# Patient Record
Sex: Male | Born: 1965 | Race: White | Hispanic: No | Marital: Single | Smoking: Never smoker
Health system: Southern US, Community
[De-identification: ages and names within clinical notes are randomized; demographics above are authoritative.]

## PROBLEM LIST (undated history)

## (undated) DIAGNOSIS — I10 Essential (primary) hypertension: Secondary | ICD-10-CM

## (undated) DIAGNOSIS — I82409 Acute embolism and thrombosis of unspecified deep veins of unspecified lower extremity: Secondary | ICD-10-CM

## (undated) DIAGNOSIS — E119 Type 2 diabetes mellitus without complications: Secondary | ICD-10-CM

---

## 2019-01-26 ENCOUNTER — Emergency Department
Admission: EM | Admit: 2019-01-26 | Discharge: 2019-01-26 | Disposition: A | Discharge status: Home / Self Care | Payer: 59 | Source: Home / Self Care | Attending: Family Medicine | Admitting: Family Medicine

## 2019-01-26 ENCOUNTER — Emergency Department: Payer: 59

## 2019-01-26 ENCOUNTER — Other Ambulatory Visit: Payer: Self-pay

## 2019-01-26 DIAGNOSIS — I8002 Phlebitis and thrombophlebitis of superficial vessels of left lower extremity: Secondary | ICD-10-CM | POA: Diagnosis not present

## 2019-01-26 DIAGNOSIS — L03116 Cellulitis of left lower limb: Secondary | ICD-10-CM | POA: Diagnosis not present

## 2019-01-26 DIAGNOSIS — I824Z2 Acute embolism and thrombosis of unspecified deep veins of left distal lower extremity: Secondary | ICD-10-CM

## 2019-01-26 LAB — POCT CBC W AUTO DIFF (K'VILLE URGENT CARE)

## 2019-01-26 MED ORDER — APIXABAN 5 MG PO TABS: ORAL_TABLET | ORAL | 0 refills | Status: AC

## 2019-01-26 MED ORDER — CLINDAMYCIN HCL 300 MG PO CAPS: ORAL_CAPSULE | ORAL | 0 refills | Status: AC

## 2019-01-26 MED ORDER — APIXABAN 5 MG PO TABS: 5.0000 mg | ORAL_TABLET | Freq: Two times a day (BID) | ORAL | 0 refills | Status: AC

## 2019-01-26 NOTE — Discharge Instructions (Addendum)
Apply heating pad to left knee and thigh 2 or 3 times daily.  Elevate leg whenever possible.  If symptoms become significantly worse during the night or over the weekend, proceed to the local emergency room.

## 2019-01-26 NOTE — ED Triage Notes (Addendum)
Pt noticed a knot on left leg where he has a varicose vein 2 days ago.  It keeps getting larger.

## 2019-01-26 NOTE — ED Provider Notes (Signed)
[facility]    CSN: [id number] Arrival date & time: 01/26/19  1539     History   Chief Complaint Chief Complaint  Patient presents with   Leg Pain    HPI Brandon Sicksdward Pyle Jr. is a 53 y.o. male.   Two days ago patient noticed a tender "knot" on his left leg below the patella.  He has developed increasing pain, erythema, and mild warmth extending from below his left knee to his left mid-medial thigh.  He recalls no injury but admits that he often must kneel on his left knee at work.  He denies chest pain or shortness of breath, and no fevers, chills, and sweats.  The history is provided by the patient.  Leg Pain Location:  Leg and knee Time since incident:  2 days Injury: no   Leg location:  L leg and L upper leg Knee location:  L knee Pain details:    Quality:  Aching   Radiates to:  Does not radiate   Severity:  Mild   Onset quality:  Gradual   Timing:  Constant   Progression:  Worsening Chronicity:  New Prior injury to area:  No Relieved by:  None tried Worsened by:  Extension and flexion Ineffective treatments:  None tried Associated symptoms: swelling   Associated symptoms: no decreased ROM, no fatigue, no fever, no itching, no muscle weakness, no numbness, no stiffness and no tingling     History reviewed:  No history of previous bleeding, cancer, renal or liver failure, thrombocytopenia, previous stroke, diabetes, anemia, antiplatelet therapy, recent surgery, frequent falls, alcohol abuse  Active problems:  none  Past surgical history not recorded by patient     Home Medications    Prior to Admission medications   Medication Sig Start Date End Date Taking? Authorizing Provider  apixaban (ELIQUIS) 5 MG TABS tablet Take 2 tabs PO BID for 7 days 01/26/19   Lattie HawBeese, Kerwin Augustus A, MD  apixaban (ELIQUIS) 5 MG TABS tablet Take 1 tablet (5 mg total) by mouth 2 (two) times daily. 01/26/19   Lattie HawBeese, Jadis Mika A, MD  clindamycin (CLEOCIN) 300 MG capsule Take one  cap PO Q6hr 01/26/19   Lattie HawBeese, Kyerra Vargo A, MD    Family History No family history of bleeding disorders.  Social History Social History   Tobacco Use   Smoking status: Not smoker  Substance Use Topics   Alcohol use: Not on file   Drug use: Not on file     Allergies   Patient has no known allergies.   Review of Systems Review of Systems  Constitutional: Negative for activity change, appetite change, chills, diaphoresis, fatigue and fever.  HENT: Negative.   Eyes: Negative.   Respiratory: Negative for cough, chest tightness, shortness of breath, wheezing and stridor.   Cardiovascular: Positive for leg swelling. Negative for chest pain.  Gastrointestinal: Negative.   Genitourinary: Negative.   Musculoskeletal: Negative.  Negative for stiffness.  Skin: Positive for color change. Negative for itching and wound.  Neurological: Negative for headaches.     Physical Exam Triage Vital Signs ED Triage Vitals  Enc Vitals Group     BP      Pulse      Resp      Temp      Temp src      SpO2      Weight      Height      Head Circumference      Peak Flow  Pain Score      Pain Loc      Pain Edu?      Excl. in GC?    No data found.  Updated Vital Signs BP (!) 165/105 (BP Location: Right Arm)    Pulse (!) 106    Temp 98.4 F (36.9 C) (Oral)    Resp 20    Ht 5\' 11"  (1.803 m)    Wt 108.9 kg    SpO2 96%    BMI 33.47 kg/m   Visual Acuity Right Eye Distance:   Left Eye Distance:   Bilateral Distance:    Right Eye Near:   Left Eye Near:    Bilateral Near:     Physical Exam Vitals signs and nursing note reviewed.  Constitutional:      General: He is not in acute distress.    Appearance: He is obese.  HENT:     Head: Normocephalic.     Right Ear: External ear normal.     Left Ear: External ear normal.     Nose: Nose normal.     Mouth/Throat:     Pharynx: Oropharynx is clear.  Eyes:     Conjunctiva/sclera: Conjunctivae normal.     Pupils: Pupils are equal,  round, and reactive to light.  Cardiovascular:     Rate and Rhythm: Tachycardia present.     Heart sounds: Normal heart sounds.  Pulmonary:     Breath sounds: Normal breath sounds.  Abdominal:     Palpations: Abdomen is soft.     Tenderness: There is no abdominal tenderness.  Musculoskeletal:        General: Swelling present.     Right lower leg: No edema.     Left lower leg: He exhibits tenderness and swelling. He exhibits no bony tenderness and no laceration. No edema.       Legs:     Comments: Left anterior leg has erythema, tenderness to palpation, warmth, and mild swelling from below the knee, ascending up to the left medial thigh as noted on diagram. No induration or fluctuance.  No left calf tenderness.  Distal neurovascular function is intact.   Lymphadenopathy:     Cervical: No cervical adenopathy.  Skin:    General: Skin is warm and dry.  Neurological:     Mental Status: He is alert.      UC Treatments / Results  Labs (all labs ordered are listed, but only abnormal results are displayed) Labs Reviewed  COMPLETE METABOLIC PANEL WITH GFR  POCT CBC W AUTO DIFF ([facility]):  WBC 8.9; LY 24.2; MO 2.1; GR 73.7; Hgb 17.1; Platelets 222     EKG None  Radiology [place] Venous Img Lower Unilateral Left  Result Date: 01/26/2019 CLINICAL DATA:  Left lower extremity pain and edema past 2 days. Evaluate for DVT. EXAM: LEFT LOWER EXTREMITY VENOUS DOPPLER ULTRASOUND TECHNIQUE: Gray-scale sonography with graded compression, as well as color Doppler and duplex ultrasound were performed to evaluate the lower extremity deep venous systems from the level of the common femoral vein and including the common femoral, femoral, profunda femoral, popliteal and calf veins including the posterior tibial, peroneal and gastrocnemius veins when visible. The superficial great saphenous vein was also interrogated. Spectral Doppler was utilized to evaluate flow at rest and with distal  augmentation maneuvers in the common femoral, femoral and popliteal veins. COMPARISON:  None. FINDINGS: Contralateral Common Femoral Vein: Respiratory phasicity is normal and symmetric with the symptomatic side. No evidence of thrombus.  Normal compressibility. Common Femoral Vein: No evidence of thrombus. Normal compressibility, respiratory phasicity and response to augmentation. Saphenofemoral Junction: No evidence of thrombus. Normal compressibility and flow on color Doppler imaging. Profunda Femoral Vein: No evidence of thrombus. Normal compressibility and flow on color Doppler imaging. Femoral Vein: No evidence of thrombus. Normal compressibility, respiratory phasicity and response to augmentation. Popliteal Vein: No evidence of thrombus. Normal compressibility, respiratory phasicity and response to augmentation. Calf Veins: There is hypoechoic occlusive thrombus within one of the paired left posterior tibial veins (image 30). The adjacent paired posterior tibial vein as well as the left peroneal vein both of appear patent where imaged. Superficial Great Saphenous Vein: No evidence of thrombus. Normal compressibility. Other Findings: There is age-indeterminate occlusive thrombus within a prominent superficial varicosity involving the medial aspect of the left lower thigh extending to the level of the anterior knee (images 33 through 39). IMPRESSION: 1. The examination is positive for occlusive DVT involving one of the paired left posterior tibial veins. There is no extension of this distal tibial DVT to the more proximal venous system of the left lower extremity. 2. Examination is positive for age-indeterminate occlusive superficial thrombophlebitis involving a prominent superficial varicosity within the medial distal thigh/anterior knee. There is no definitive extension of this age-indeterminate occlusive SVT to the deep venous system of the left lower extremity. Electronically Signed   By: Sandi Mariscal M.D.    On: 01/26/2019 17:17    Procedures Procedures (including critical care time)  Medications Ordered in UC Medications - No data to display  Initial Impression / Assessment and Plan / UC Course  I have reviewed the triage vital signs and the nursing notes.  Pertinent labs & imaging results that were available during my care of the patient were reviewed by me and considered in my medical decision making (see chart for details).    CBC unremarkable.  CMP pending (check glucose, and liver and renal function) Begin Eliquis 5mg , 2 BID for one week (Rx #28, no refill).  Begin Clindamycin 300mg  Q6hr for 10 days. Patient should follow-up with his PCP in about 5 or 6 days.  After first week, should decrease Eliquis 5mg  to one BID. Given Rx for Eliquis 5mg , one BID, #60, no refill Recommend wearing knee pads at work.  Final Clinical Impressions(s) / UC Diagnoses   Final diagnoses:  Lower leg DVT (deep venous thromboembolism), acute, left (HCC)  Superficial thrombophlebitis of left leg  Cellulitis of leg, left     Discharge Instructions     Apply heating pad to left knee and thigh 2 or 3 times daily.  Elevate leg whenever possible.  If symptoms become significantly worse during the night or over the weekend, proceed to the local emergency room.     ED Prescriptions    Medication Sig Dispense Auth. Provider   apixaban (ELIQUIS) 5 MG TABS tablet Take 2 tabs PO BID for 7 days 28 tablet Kandra Nicolas, MD   clindamycin (CLEOCIN) 300 MG capsule Take one cap PO Q6hr 30 capsule Kandra Nicolas, MD   apixaban (ELIQUIS) 5 MG TABS tablet Take 1 tablet (5 mg total) by mouth 2 (two) times daily. 60 tablet Kandra Nicolas, MD        Kandra Nicolas, MD 01/28/19 [phone]

## 2019-01-27 ENCOUNTER — Telehealth: Payer: Self-pay

## 2019-01-27 LAB — COMPLETE METABOLIC PANEL WITH GFR
AG Ratio: 1.4 (calc) (ref 1.0–2.5)
ALT: 25 U/L (ref 9–46)
AST: 15 U/L (ref 10–35)
Albumin: 4.4 g/dL (ref 3.6–5.1)
Alkaline phosphatase (APISO): 87 U/L (ref 35–144)
BUN: 16 mg/dL (ref 7–25)
CO2: 27 mmol/L (ref 20–32)
Calcium: 10.1 mg/dL (ref 8.6–10.3)
Chloride: 100 mmol/L (ref 98–110)
Creat: 1.18 mg/dL (ref 0.70–1.33)
GFR, Est African American: 82 mL/min/{1.73_m2} (ref >=60)
GFR, Est Non African American: 71 mL/min/{1.73_m2} (ref >=60)
Globulin: 3.2 g/dL (calc) (ref 1.9–3.7)
Glucose, Bld: 402 mg/dL — ABNORMAL HIGH (ref 65–99)
Potassium: 4.1 mmol/L (ref 3.5–5.3)
Sodium: 136 mmol/L (ref 135–146)
Total Bilirubin: 0.6 mg/dL (ref 0.2–1.2)
Total Protein: 7.6 g/dL (ref 6.1–8.1)

## 2019-01-27 NOTE — Telephone Encounter (Signed)
Left message for patient to call UC regarding lab results.

## 2019-01-27 NOTE — Telephone Encounter (Signed)
Pt called back, given lab results and information regarding BG.  Went over to check BG daily, and sx of hyperglycemia.  Pt to follow up with family Dr regarding blood clot and diabetes.

## 2019-01-30 ENCOUNTER — Telehealth: Payer: Self-pay | Admitting: Emergency Medicine

## 2019-01-30 NOTE — Telephone Encounter (Signed)
Patient called to clarify timing of [place] for r/o dvt; has option to have it done tomorrow or late next week; I recommended he have it done tomorrow and that he have results sent to his PCP, who he has an appt with for follow up.

## 2019-01-30 NOTE — Telephone Encounter (Signed)
Correction: appt for tomorrow will be with his PCP. Discussed this with Dr.Beese and he is in accord.

## 2020-11-08 IMAGING — US VENOUS DOPPLER ULTRASOUND OF LEFT LOWER EXTREMITY
1 series · 13 of 24 positions shown · non-contrast
Comparison: None.

CLINICAL DATA: Left lower extremity pain and edema past 2 days.
Evaluate for DVT.



[Series 1: venous doppler ultrasound of left lower extremity · 0.09mm/px · 13 of 57 slices shown]
[im 1/57]
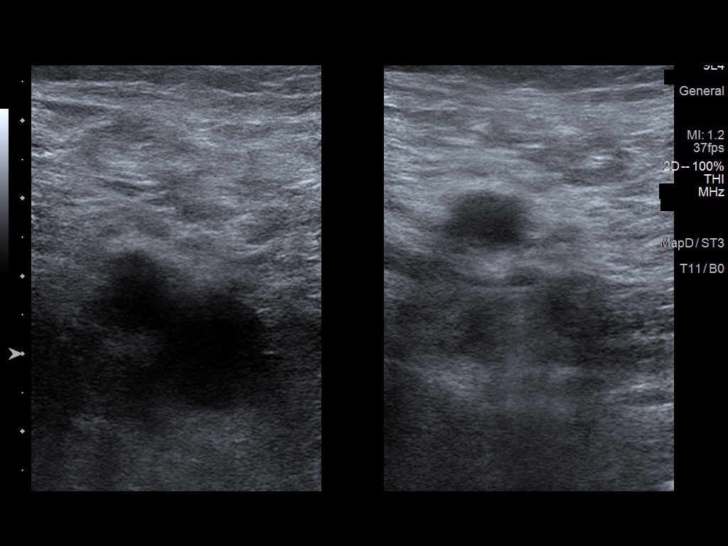
[im 5/57]
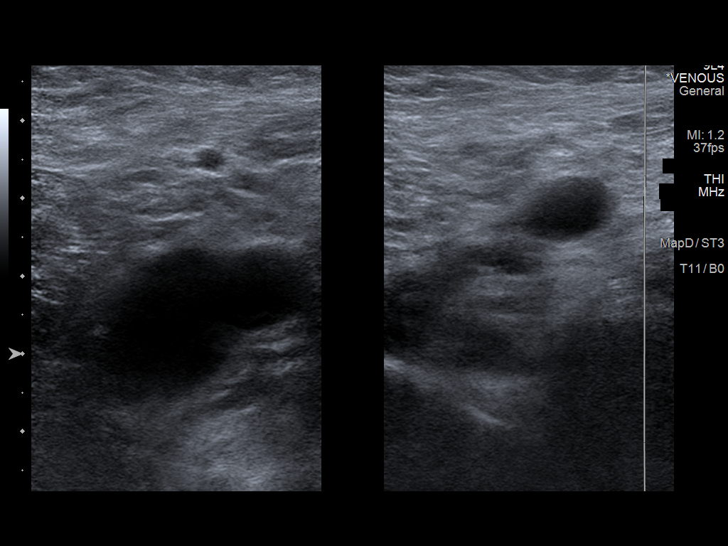
[im 10/57]
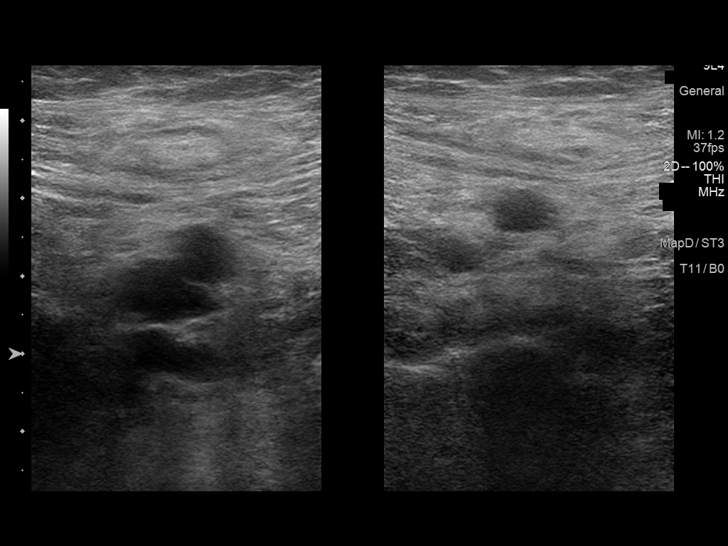
[im 15/57]
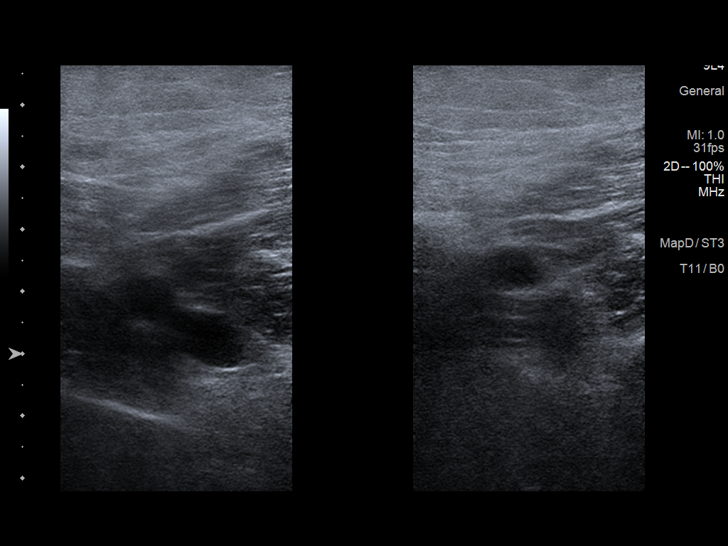
[im 20/57]
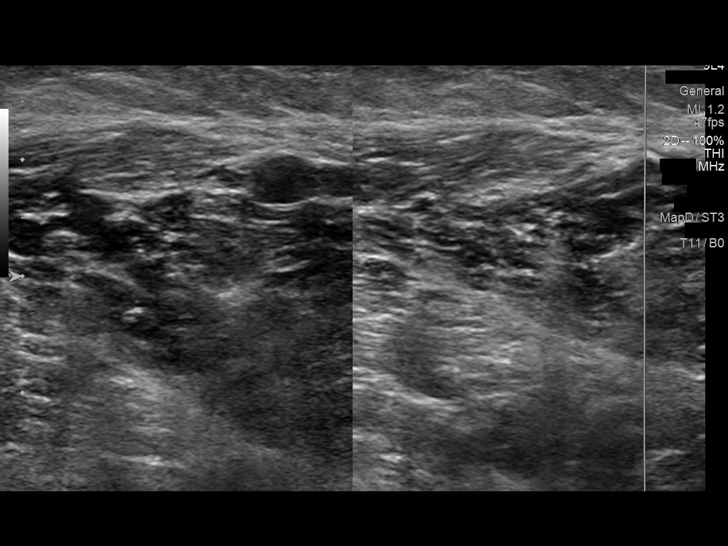
[im 25/57]
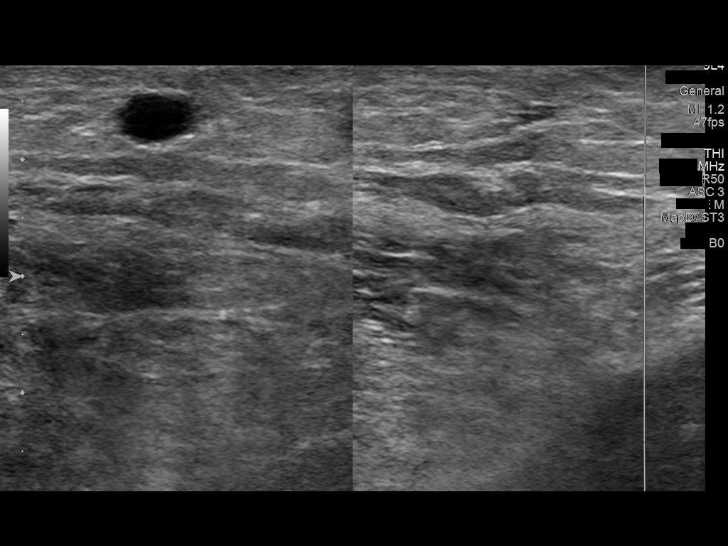
[im 30/57]
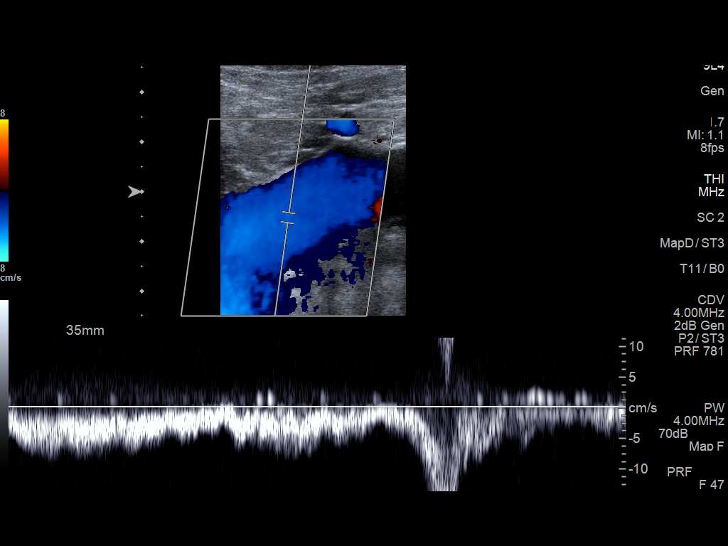
[im 32/57]
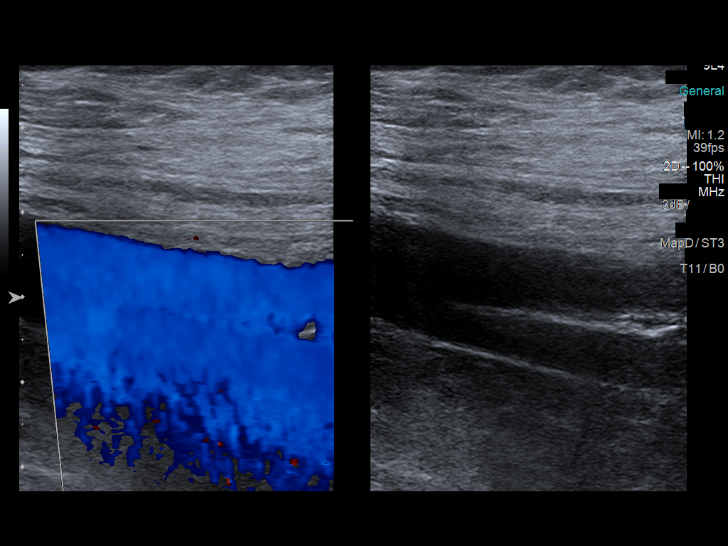
[im 37/57]
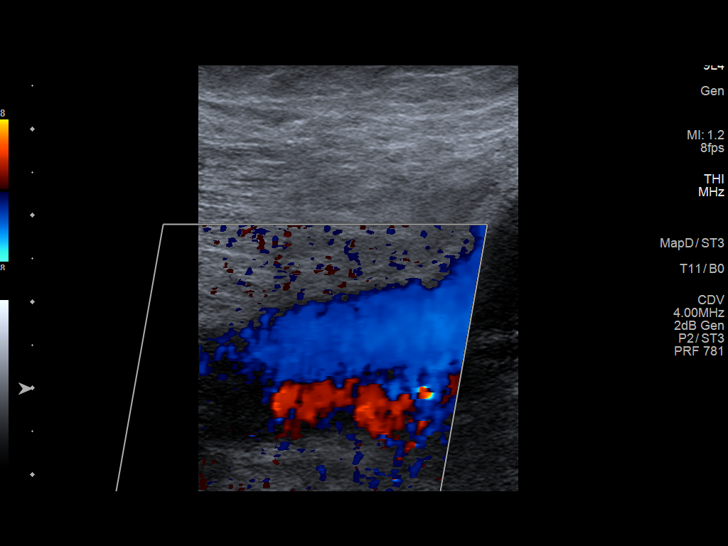
[im 42/57]
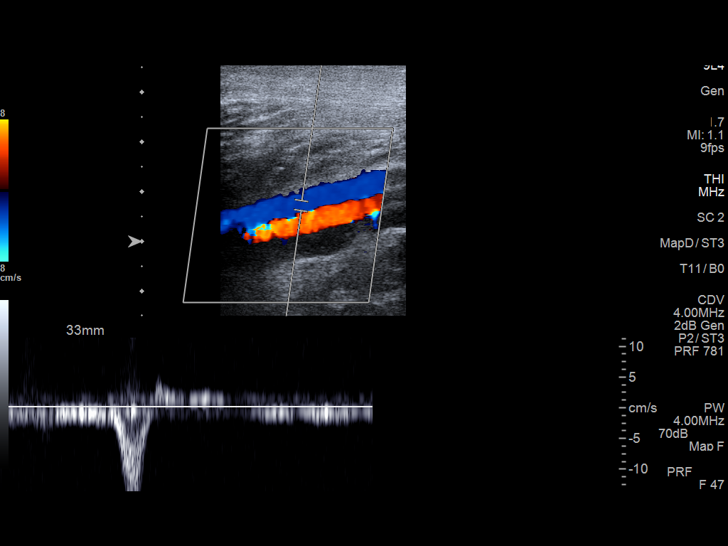
[im 47/57]
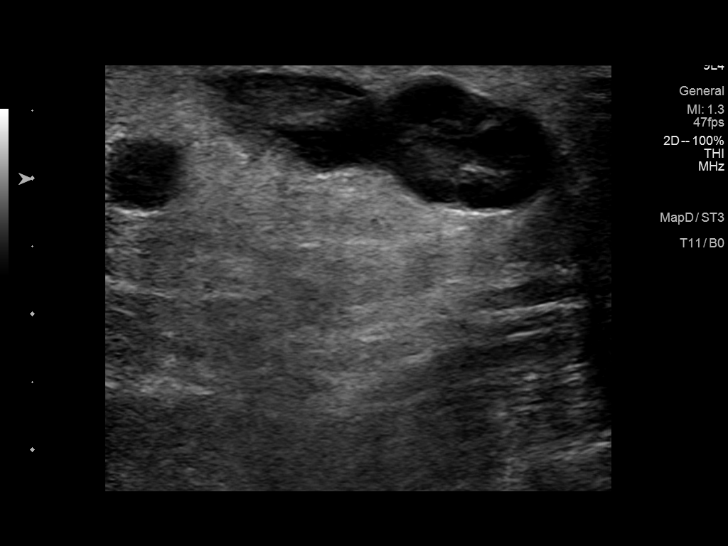
[im 52/57]
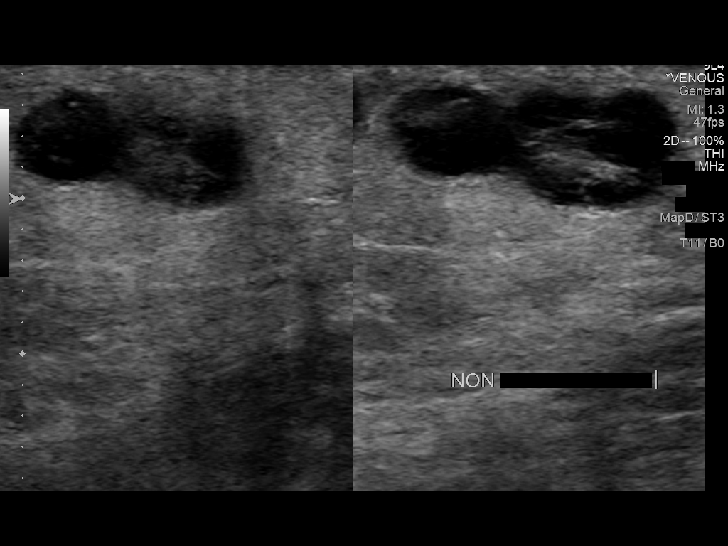
[im 57/57]
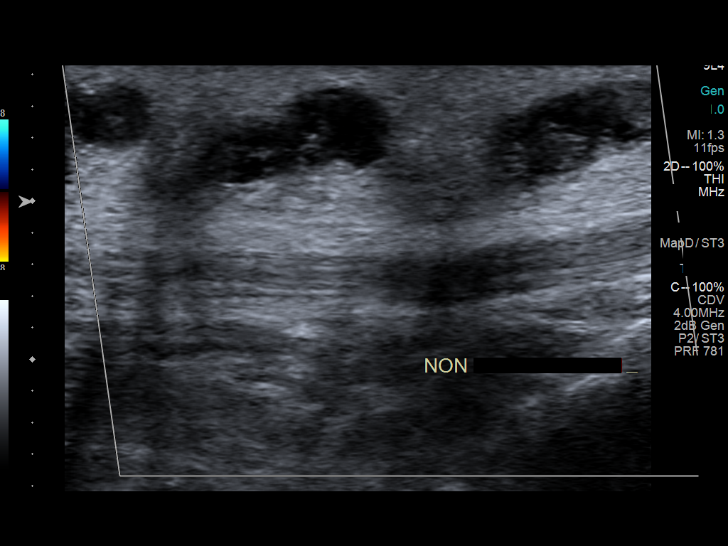

[13 of 24 positions shown; findings below may reference images not displayed]

FINDINGS: Contralateral Common Femoral Vein: Respiratory phasicity is normal
and symmetric with the symptomatic side. No evidence of thrombus.
Normal compressibility.

Common Femoral Vein: No evidence of thrombus. Normal
compressibility, respiratory phasicity and response to augmentation.

Saphenofemoral Junction: No evidence of thrombus. Normal
compressibility and flow on color Doppler imaging.

Profunda Femoral Vein: No evidence of thrombus. Normal
compressibility and flow on color Doppler imaging.

Femoral Vein: No evidence of thrombus. Normal compressibility,
respiratory phasicity and response to augmentation.

Popliteal Vein: No evidence of thrombus. Normal compressibility,
respiratory phasicity and response to augmentation.

Calf Veins: There is hypoechoic occlusive thrombus within one of the
paired left posterior tibial veins (image 30). The adjacent paired
posterior tibial vein as well as the left peroneal vein both of
appear patent where imaged.

Superficial Great Saphenous Vein: No evidence of thrombus. Normal
compressibility.

Other Findings: There is age-indeterminate occlusive thrombus within
a prominent superficial varicosity involving the medial aspect of
the left lower thigh extending to the level of the anterior knee
(images 33 through 39).
IMPRESSION: 1. The examination is positive for occlusive DVT involving one of
the paired left posterior tibial veins. There is no extension of
this distal tibial DVT to the more proximal venous system of the
left lower extremity.
2. Examination is positive for age-indeterminate occlusive
superficial thrombophlebitis involving a prominent superficial
varicosity within the medial distal thigh/anterior knee. There is no
definitive extension of this age-indeterminate occlusive SVT to the
deep venous system of the left lower extremity.

## 2022-04-20 ENCOUNTER — Ambulatory Visit
Admission: EM | Admit: 2022-04-20 | Discharge: 2022-04-20 | Disposition: A | Discharge status: Other Acute Inpatient Hospital | Payer: 59

## 2022-04-20 DIAGNOSIS — R42 Dizziness and giddiness: Secondary | ICD-10-CM

## 2022-04-20 DIAGNOSIS — I10 Essential (primary) hypertension: Secondary | ICD-10-CM

## 2022-04-20 DIAGNOSIS — R000 Tachycardia, unspecified: Secondary | ICD-10-CM | POA: Diagnosis not present

## 2022-04-20 DIAGNOSIS — M79605 Pain in left leg: Secondary | ICD-10-CM | POA: Diagnosis not present

## 2022-04-20 HISTORY — DX: Type 2 diabetes mellitus without complications: E11.9

## 2022-04-20 HISTORY — DX: Essential (primary) hypertension: I10

## 2022-04-20 HISTORY — DX: Acute embolism and thrombosis of unspecified deep veins of unspecified lower extremity: I82.409

## 2022-04-20 NOTE — ED Notes (Signed)
Patient is being discharged from the Urgent Care and sent to the Emergency Department via EMS . Per Kathi Ludwig, patient is in need of higher level of care due to dizziness. Patient is aware and verbalizes understanding of plan of care.  Vitals:   04/20/22 0941  BP: (!) 153/100  Pulse: (!) 120  Resp: 14  Temp: 98.8 F (37.1 C)  SpO2: 90%

## 2022-04-20 NOTE — ED Provider Notes (Signed)
[facility]    CSN: [id number] Arrival date & time: 04/20/22  0901      History   Chief Complaint Chief Complaint  Patient presents with   Dizziness   Leg Pain    HPI Brandon Potter. is a 56 y.o. male.   HPI 56 year old male presents with dizziness and leg pain that began on Friday.  Patient endorses history of blood clot in the left leg patient is currently on apixaban.  PMH significant for T2DM and HTN.  Patient reports he is scheduled to have follow-up appointment with his PCP on Friday of this week, 04/24/2022.  Past Medical History:  Diagnosis Date   Diabetes mellitus without complication (HCC)    DVT (deep venous thrombosis) (HCC)    Hypertension     There are no problems to display for this patient.   History reviewed. No pertinent surgical history.     Home Medications    Prior to Admission medications   Medication Sig Start Date End Date Taking? Authorizing Provider  atorvastatin (LIPITOR) 20 MG tablet Take 20 mg by mouth daily.   Yes [provider]  Dulaglutide (TRULICITY [place]) Inject into the skin.   Yes [provider]  lisinopril (ZESTRIL) 10 MG tablet Take 10 mg by mouth daily.   Yes [provider]  metFORMIN (GLUCOPHAGE) 500 MG tablet Take by mouth 2 (two) times daily with a meal.   Yes [provider]  apixaban (ELIQUIS) 5 MG TABS tablet Take 2 tabs PO BID for 7 days Patient not taking: Reported on 04/20/2022 01/26/19   Lattie Haw, MD  apixaban (ELIQUIS) 5 MG TABS tablet Take 1 tablet (5 mg total) by mouth 2 (two) times daily. Patient not taking: Reported on 04/20/2022 01/26/19   Lattie Haw, MD  clindamycin (CLEOCIN) 300 MG capsule Take one cap PO Q6hr Patient not taking: Reported on 04/20/2022 01/26/19   Lattie Haw, MD    Family History History reviewed. No pertinent family history.  Social History Social History   Tobacco Use   Smoking status: Never   Smokeless tobacco: Never   Substance Use Topics   Alcohol use: Never     Allergies   Patient has no known allergies.   Review of Systems Review of Systems  Cardiovascular:  Positive for leg swelling.       Left upper leg pain, noted bilateral edema with varicosities noted     Physical Exam Triage Vital Signs ED Triage Vitals  Enc Vitals Group     BP 04/20/22 0941 (!) 153/100     Pulse Rate 04/20/22 0941 (!) 120     Resp 04/20/22 0941 14     Temp 04/20/22 0941 98.8 F (37.1 C)     Temp Source 04/20/22 0941 Oral     SpO2 04/20/22 0941 90 %     Weight --      Height --      Head Circumference --      Peak Flow --      Pain Score 04/20/22 0939 0     Pain Loc --      Pain Edu? --      Excl. in GC? --    No data found.  Updated Vital Signs BP (!) 153/100 (BP Location: Right Arm)   Pulse (!) 120   Temp 98.8 F (37.1 C) (Oral)   Resp 14   SpO2 90%    Physical Exam Vitals and nursing note reviewed.  Constitutional:      General: He is not in acute distress.    Appearance: Normal appearance. He is obese. He is ill-appearing.  HENT:     Head: Normocephalic and atraumatic.     Mouth/Throat:     Mouth: Mucous membranes are moist.     Pharynx: Oropharynx is clear.  Eyes:     Extraocular Movements: Extraocular movements intact.     Conjunctiva/sclera: Conjunctivae normal.     Pupils: Pupils are equal, round, and reactive to light.  Neck:     Comments: No JVD, no bruit Cardiovascular:     Rate and Rhythm: Tachycardia present.     Heart sounds: Normal heart sounds.     Comments: Hypertensive; PT, femoral pulses palpable bilaterally Pulmonary:     Effort: Pulmonary effort is normal.     Breath sounds: Normal breath sounds. No wheezing, rhonchi or rales.  Musculoskeletal:     Cervical back: Normal range of motion and neck supple. No tenderness.     Right lower leg: Edema present.     Left lower leg: Edema present.     Comments: +3 nonpitting/pitting edema noted-bilaterally   Lymphadenopathy:     Cervical: No cervical adenopathy.  Skin:    General: Skin is warm and dry.  Neurological:     General: No focal deficit present.     Mental Status: He is alert and oriented to person, place, and time. Mental status is at baseline.      UC Treatments / Results  Labs (all labs ordered are listed, but only abnormal results are displayed) Labs Reviewed - No data to display  EKG   Radiology No results found.  Procedures Procedures (including critical care time)  Medications Ordered in UC Medications - No data to display  Initial Impression / Assessment and Plan / UC Course  I have reviewed the triage vital signs and the nursing notes.  Pertinent labs & imaging results that were available during my care of the patient were reviewed by me and considered in my medical decision making (see chart for details).     MDM: 1.  Dizziness-EMS notified to transport patient to [place] emergency department for further evaluation; high probability of left upper leg DVT, EKG reveals sinus tachycardia, patient hypertensive at 153/100; pulse ox no higher than 90% patient agrees to be transported via EMS; 2. Leg pain, superior, or left-highly suspicious for DVT; 3.  Sinus tachycardia-noted on EKG this morning; 4.  Hypertensive-currently on Zestril 10 mg. Patient transported to [facility] health emergency department for dizziness, shortness of breath, sinus tachycardia, left superior leg pain, and hypertensive.  Patient discharged via EMS transport. Final Clinical Impressions(s) / UC Diagnoses   Final diagnoses:  Dizziness  Essential hypertension  Sinus tachycardia  Leg pain, superior, left     Discharge Instructions      Patient transported to [place] emergency department for dizziness, shortness of breath, sinus tachycardia, left superior leg pain, and hypertensive.     ED Prescriptions   None    PDMP not reviewed this  encounter.   Eliezer Lofts, [place] 04/20/22 1044

## 2022-04-20 NOTE — ED Triage Notes (Signed)
Pt presents with c/o left leg pain and dizziness that began Friday. Pt endorses hx of blood clot in left leg.

## 2022-04-20 NOTE — Discharge Instructions (Addendum)
Patient transported to [facility] health emergency department for dizziness, shortness of breath, sinus tachycardia, left superior leg pain, and hypertensive.

## 2022-04-21 ENCOUNTER — Telehealth: Payer: Self-pay

## 2022-04-21 NOTE — Telephone Encounter (Signed)
TCT pt to follow up from recent visit. HIPAA compliant VM left for return call.
